# Patient Record
Sex: Female | Born: 1970 | Hispanic: Yes | Marital: Married | Smoking: Never smoker
Health system: Southern US, Community
[De-identification: ages and names within clinical notes are randomized; demographics above are authoritative.]

## PROBLEM LIST (undated history)

## (undated) DIAGNOSIS — E079 Disorder of thyroid, unspecified: Secondary | ICD-10-CM

## (undated) DIAGNOSIS — I499 Cardiac arrhythmia, unspecified: Secondary | ICD-10-CM

## (undated) DIAGNOSIS — E039 Hypothyroidism, unspecified: Secondary | ICD-10-CM

## (undated) DIAGNOSIS — D649 Anemia, unspecified: Secondary | ICD-10-CM

## (undated) HISTORY — PX: NO PAST SURGERIES: SHX2092

## (undated) HISTORY — PX: ABDOMINAL HYSTERECTOMY: SHX81

---

## 2004-12-09 ENCOUNTER — Ambulatory Visit: Payer: Self-pay | Admitting: Family Medicine

## 2015-04-30 ENCOUNTER — Encounter: Payer: Self-pay | Admitting: Emergency Medicine

## 2015-04-30 ENCOUNTER — Emergency Department
Admission: EM | Admit: 2015-04-30 | Discharge: 2015-04-30 | Disposition: A | Discharge status: Home / Self Care | Payer: Self-pay | Attending: Emergency Medicine | Admitting: Emergency Medicine

## 2015-04-30 DIAGNOSIS — N939 Abnormal uterine and vaginal bleeding, unspecified: Secondary | ICD-10-CM | POA: Insufficient documentation

## 2015-04-30 DIAGNOSIS — D509 Iron deficiency anemia, unspecified: Secondary | ICD-10-CM | POA: Insufficient documentation

## 2015-04-30 HISTORY — DX: Disorder of thyroid, unspecified: E07.9

## 2015-04-30 LAB — CBC
HEMATOCRIT: 26.6 % — AB (ref 35.0–47.0)
HEMOGLOBIN: 8 g/dL — AB (ref 12.0–16.0)
MCH: 21 pg — AB (ref 26.0–34.0)
MCHC: 29.9 g/dL — ABNORMAL LOW (ref 32.0–36.0)
MCV: 70.2 fL — AB (ref 80.0–100.0)
PLATELETS: 359 10*3/uL (ref 150–440)
RBC: 3.79 MIL/uL — AB (ref 3.80–5.20)
RDW: 19.6 % — ABNORMAL HIGH (ref 11.5–14.5)
WBC: 8.2 10*3/uL (ref 3.6–11.0)

## 2015-04-30 LAB — TYPE AND SCREEN
ABO/RH(D): O POS
Antibody Screen: NEGATIVE

## 2015-04-30 LAB — ABO/RH: ABO/RH(D): O POS

## 2015-04-30 NOTE — ED Notes (Addendum)
Lab results back; WBC 8.2, Hgb 8.0, Hct 26.6, and platelets 359. Results compared to previous visits with no significant changes noted in Hgb/Hct from 04/24/15 visit; results reviewed with EDP. Patient is asymptomatic at the time of triage other than slight pallor and reports of fatigue. Patient with VSS; HR 74, R16, BP 118/61. EDP advising that patient would not be emergently transfused tonight based on labs, VS, and presenting condition. EDP advising that OB/GYN that sent patient to ED for 2 unit PRBC transfusion be contacted to see if she wanted her to be admitted to her service directly, otherwise patient would be referred back to her for outpatient transfusion.

## 2015-04-30 NOTE — ED Notes (Signed)
Dr. Newman Nip returned page at this time; spoke with this RN. MD made aware of labs, VS, and presenting condition. MD also made aware of conversations between this RN and EDPs Archie Balboa and [place]) that indicated that patient would not be transfused tonight on an emergent basis based on the aforementioned. Per Dr. Newman Nip, patient was sent to the ED for transfusion based on the fact that she was indeed symptomatic. MD cited that patient had profoundly low iron. Increasing fatigue, continued bleeding, and it is expected that patient's Hgb/Hct will continue to drop. MD made aware that patient's labs today as compared to the last ones that we have access to that were performed on 04/24/15. MD wanting patient to be asked if she would like to be admitted tonight through the OB/GYN service, or if she wanted to have transfusion scheduled on an outpatient basis tomorrow. This RN to speak with patient and call [place], MD back for disposition.

## 2015-04-30 NOTE — ED Notes (Signed)
Dr. Newman Nip paged by this RN.

## 2015-04-30 NOTE — ED Notes (Signed)
Patient presents to the ED with vaginal bleeding x 12 days and low hemoglobin.  Patient was sent to the ED from her primary care doctor with instructions to receive 2 units of packed red blood cells for severe iron deficient anemia.  Patient is scheduled for a hysterectomy in a couple of weeks.  Patient appears pale.  Mucous membranes appear within normal limits.

## 2015-04-30 NOTE — ED Notes (Signed)
Patient presented with options of being admitted to hospital for blood transfusion, or to go home and follow up with Dr. Newman Nip tomorrow. Options reviewed with patient via interpreter; questions fielded at length by RN and interpreter. Patient electing to follow up tomorrow on an outpatient basis - this RN to return call to [facility], MD to make her aware that patient does not want to be admitted and feels like she can wait until tomorrow and see her on an outpatient basis. MD acknowledged decision and advised to make patient aware that the office would contact her tomorrow with further instructions regarding blood transfusion and continued care.

## 2015-05-02 ENCOUNTER — Ambulatory Visit: Payer: Self-pay | Admitting: Oncology

## 2015-05-05 ENCOUNTER — Inpatient Hospital Stay: Payer: BC Managed Care – PPO | Attending: Oncology | Admitting: Oncology

## 2015-05-05 VITALS — BP 116/74 | HR 69 | Temp 99.5°F | Resp 16 | Wt 188.5 lb

## 2015-05-05 DIAGNOSIS — N939 Abnormal uterine and vaginal bleeding, unspecified: Secondary | ICD-10-CM | POA: Insufficient documentation

## 2015-05-05 DIAGNOSIS — E079 Disorder of thyroid, unspecified: Secondary | ICD-10-CM | POA: Diagnosis not present

## 2015-05-05 DIAGNOSIS — D500 Iron deficiency anemia secondary to blood loss (chronic): Secondary | ICD-10-CM | POA: Diagnosis present

## 2015-05-05 DIAGNOSIS — D509 Iron deficiency anemia, unspecified: Secondary | ICD-10-CM | POA: Insufficient documentation

## 2015-05-05 DIAGNOSIS — D649 Anemia, unspecified: Secondary | ICD-10-CM

## 2015-05-05 NOTE — Progress Notes (Signed)
Patient has history of anemia that has recently gotten worse due to vaginal bleeding for the past 2 months.  She is scheduled for hysterectomy on 05/30/2015 and her GYN would like for her to be evaluated for possible blood transfusion before surgery.

## 2015-05-08 ENCOUNTER — Inpatient Hospital Stay: Payer: BC Managed Care – PPO

## 2015-05-08 VITALS — BP 100/63 | HR 71 | Temp 96.2°F | Resp 18

## 2015-05-08 DIAGNOSIS — D509 Iron deficiency anemia, unspecified: Secondary | ICD-10-CM

## 2015-05-08 DIAGNOSIS — D500 Iron deficiency anemia secondary to blood loss (chronic): Secondary | ICD-10-CM | POA: Diagnosis not present

## 2015-05-08 MED ORDER — SODIUM CHLORIDE 0.9 % IV SOLN
Freq: Once | INTRAVENOUS | Status: AC
  Administered 2015-05-08: 11:00:00 via INTRAVENOUS
  Filled 2015-05-08: qty 1000

## 2015-05-08 MED ORDER — SODIUM CHLORIDE 0.9 % IV SOLN
510.0000 mg | Freq: Once | INTRAVENOUS | Status: AC
  Administered 2015-05-08: 510 mg via INTRAVENOUS
  Filled 2015-05-08: qty 17

## 2015-05-08 NOTE — H&P (Signed)
GYN PRE-OP VISIT  CC: menorrhagia, fatigue, fibroid uterus   Subjective:    Kerry Paul is a 45 y.o. female who presents for preop visit for TLH/BS, cystoscopy for fibroid uterus.   Feeling ok today. Bleeding has stopped now that she is on the provera daily. Has appt w/ heme for IV iron infusion today.   Hx of AUB: Patient has been having menorrhagia for the past year worsening over the past 2 months. She has been bleeding for the past 12 days, sometimes passing large clots and going through a pad and tampon every 2 hours. She was in [place] over the holidays and was told that she needed blood transfusions and surgery to remove her uterus. She was given provera which stopped the bleeding initially but now it is back. She has stopped taking the provera. Al ultrasound done in the ED showed a large fibroid uterus with a SM fibroid (3cm). She has been seen in the ED multiple times in the past 2 months with hb 7.4-8.0. They did not transfuse her there because her vitals were stable.  She also c/o significant fatigue with inability to do her daily activities. She sometimes "passes out" from the weakness. She is on iron twice daily for severe iron deficiency anemia but that has not helped her fatigue.  She is interested in definitive treatment with hysterectomy.  She is now seeing hematology for iron infusions   She also has hypothyroidism on synthroid.   Gynecologic History Patient's last menstrual period was 04/23/2015 (exact date).  Menarche: 14 Normal until this past year  Contraception: none Sexually active: no Desires STD screening: no  Last Pap: 01/2014. Results were: normal - no h/o abnl paps  Obstetric History            OB History  Gravida Para Term Preterm AB SAB TAB Ectopic Multiple Living  0 0 0 0 0 0 0 0 0 0       NSVD x 2  Past Medical History:  has a past medical history of Hypothyroidism (acquired), unspecified. Problem List: has Hypothyroidism  (acquired), unspecified; Major depressive disorder, single episode, unspecified; Other early skin lesions of yaws; Asymptomatic varicose veins of lower extremity; Anemia due to blood loss; Submucous myoma of uterus; and Menorrhagia with regular cycle on her problem list. Past Surgical History:  has no past surgical history on file. Family History: family history includes Deep vein thrombosis in her maternal grandmother; Diabetes type II in her paternal grandmother; Hyperlipidemia in her paternal grandmother; Hypertension in her mother and paternal grandmother; No Known Problems in her brother, brother, brother, daughter, father, son, and son. Social History:  reports that she has never smoked. She has never used smokeless tobacco. She reports that she does not drink alcohol or use illicit drugs. Current Medications: has a current medication list which includes the following prescription(s): ferrous sulfate, levothyroxine, and medroxyprogesterone. Prior to encounter Medications:        Current Outpatient Prescriptions on File Prior to Visit  Medication Sig Dispense Refill  . ferrous sulfate 325 (65 FE) MG EC tablet Take 1 tablet (325 mg total) by mouth 3 (three) times daily with meals. 30 tablet 0  . levothyroxine (SYNTHROID, LEVOTHROID) 50 MCG tablet TAKE ONE TABLET BY MOUTH ONCE DAILY    . medroxyPROGESTERone (PROVERA) 10 MG tablet Take 1 tablet (10 mg total) by mouth once daily. 40 tablet 0   No current facility-administered medications on file prior to visit.    Allergies: has No Known  Allergies.  Review of Systems 14 systems reviewed pertinent positives and negatives as noted in the HPI and below.   Objective:       Vitals:   05/08/15 0831  BP: 112/68  Pulse: 79   General appearance: alert, appears stated age, cooperative and fatigued Head: Normocephalic, without obvious abnormality, atraumatic Eyes: conjunctivae/corneas clear. PERRL, EOM's intact. Fundi benign. Sclera  anicteric.  Lungs: clear to auscultation bilaterally Heart: regular rate and rhythm, S1, S2 normal, no murmur, click, rub or gallop   Previous exam: Abdomen: soft, non-tender; bowel sounds normal; no masses, no organomegaly Pelvic: cervix normal in appearance, external genitalia normal, no adnexal masses or tenderness, no bladder tenderness, no cervical motion tenderness, rectovaginal septum normal, urethra without abnormality or discharge, vagina normal without discharge and 13 wk uterus, mobile with some descent Extremities: extremities normal, atraumatic, no cyanosis or edema Skin: Skin color, texture, turgor normal. No rashes or lesions Lymph nodes: Inguinal adenopathy: none     04/11/15 TVUS: The uterus is anteverted. It measures 11.3 x 6.6 x 8.6 cm. The endometrial stripe is not well visualized. Within the anterior uterus there is a heterogenously echogenic round area that measures 3.8 x 2.5 x 4.4 cm. It has some characteristics suggesting a fibroid. In the region of the endometrium there is another heterogenous structure that measures 4.1 x 4.8 x 6.9 cm. It is not clear if this is within the endometrium, associated with the anterior uterine structure, or if it is a stand alone structure such as an endometrial mass.. Adjacent to these two large structures in the left uterus is a round hypoechoic structure that measures 2.3 x 1.9 x 1.6 cm and likely represents a submucosal fibroid. No free fluid is identified in the pelvis.  The right ovary is normal in appearance with appropriate vascular flow. The right ovary measures 4.5 x 3.4 x 4.4 cm. It contains a simple cyst that measures 3.7 x 2.6 x 2.6 cm.  The left ovary is normal in appearance with appropriate vascular flow. The left ovary measures 3.2 x 1.9 x 2.8 cm.  The urinary bladder is distended and is without abnormality.  There is a large nabothian cyst identified. It measures 2.4 x 1.8 x 2.2 cm.  Labs: -       Lab Results  Component Value Date   WBC 13.2 (H) 04/24/2015   HGB 8.0 (L) 04/24/2015   HCT 29.0 (L) 04/24/2015   PLT 350 04/24/2015   -      Lab Results  Component Value Date   NA 137 04/24/2015   K 3.6 04/24/2015   CL 105 04/24/2015   CO2 24 04/24/2015   BUN 12 04/24/2015   CREATININE 0.7 04/24/2015   CALCIUM 9.0 04/24/2015   ALB 3.5 04/24/2015   TBILI 0.2 (L) 04/24/2015   ALKPHOS 53 04/24/2015   AST 24 04/24/2015   ALT 17 04/24/2015   GLUCOSE 169 (H) 04/24/2015   GFR >60 04/24/2015   -      Lab Results  Component Value Date   TSH 1.35 04/02/2015   T4FREE 0.92 04/02/2015    04/24/15: Iron <5  01/2015: HbA1C - 5.7%  04/30/15 Embx:  FRAGMENTS OF PROLIFERATIVE PHASE ENDOMETRIUM WITH BREAKDOWN CHANGES,  CONSISTENT WITH ANOVULATORY CYCLE. NEGATIVE FOR DYSPLASIA, VIRAL  EFFECT, AND MALIGNANCY.   Assessment:    45yo G2P2 with symptomatic iron-deficiency anemia due to chronic blood loss from menorrhagia/fibroid uterus. Desiring definitive management with hysterectomy. Plan for TLH/BS/cystoscopy.  Plan:  1. Fibroid uterus - AUB - provera daily until surgery to minimize blood loss - r/b/a of TLH/BS/cystoscopy reviewed with patient including risks of bleeding/pain/infection, injury to bowel, bladder, or other organ, risk of cuff dehiscence and wound separation. All questions answered. Consents signed.  - pyridium 200mg  po in preop area  2. Anemia - pt has referral to heme for IV iron - increased to TID iron in interim with vitamin C and colace   Joylene Igo, MD

## 2015-05-09 ENCOUNTER — Ambulatory Visit: Payer: Self-pay | Admitting: Oncology

## 2015-05-15 ENCOUNTER — Inpatient Hospital Stay: Payer: BC Managed Care – PPO

## 2015-05-15 DIAGNOSIS — D500 Iron deficiency anemia secondary to blood loss (chronic): Secondary | ICD-10-CM | POA: Diagnosis not present

## 2015-05-15 DIAGNOSIS — D509 Iron deficiency anemia, unspecified: Secondary | ICD-10-CM

## 2015-05-15 MED ORDER — SODIUM CHLORIDE 0.9 % IV SOLN
510.0000 mg | Freq: Once | INTRAVENOUS | Status: AC
  Administered 2015-05-15: 510 mg via INTRAVENOUS
  Filled 2015-05-15: qty 17

## 2015-05-15 MED ORDER — SODIUM CHLORIDE 0.9 % IV SOLN
Freq: Once | INTRAVENOUS | Status: AC
  Administered 2015-05-15: 14:00:00 via INTRAVENOUS
  Filled 2015-05-15: qty 1000

## 2015-05-15 NOTE — Progress Notes (Signed)
Kerry Paul  [phone] [phone]  ID: Phineas Inches OB: 1970/11/23  MR#: [id number]  [id number]  Patient Care Team: Lorette Ang, MD as PCP - General (Obstetrics and Gynecology)  CHIEF COMPLAINT:  Chief Complaint  Patient presents with  . New Evaluation  . Anemia    INTERVAL HISTORY: Patient is a 45 year old female with a long-standing history of iron deficiency anemia that has had worsening vaginal bleeding over the past several months. She is scheduled for a hysterectomy on May 30, 2015 and gynecology would like her evaluated in treated for her anemia prior to her scheduled surgery. She currently feels well and is asymptomatic. She has no neurologic complaints. She denies any recent fevers. She does not complain of weakness or fatigue today. She has no chest pain or shortness of breath. She denies any nausea, vomiting, constipation, or diarrhea. She has no urinary complaints. Patient otherwise feels well and offers no further specific complaints.  REVIEW OF SYSTEMS:   Review of Systems  Constitutional: Negative for fever, weight loss and malaise/fatigue.  Respiratory: Negative.  Negative for cough.   Cardiovascular: Negative.  Negative for chest pain.  Gastrointestinal: Negative.  Negative for blood in stool and melena.  Genitourinary: Negative.   Musculoskeletal: Negative.   Neurological: Negative.  Negative for weakness.    As per HPI. Otherwise, a complete review of systems is negatve.  PAST MEDICAL HISTORY: Past Medical History  Diagnosis Date  . Thyroid disease     PAST SURGICAL HISTORY: No past surgical history on file.  FAMILY HISTORY: No reported history of malignancy or chronic disease.     ADVANCED DIRECTIVES:    HEALTH MAINTENANCE: Social History  Substance Use Topics  . Smoking status: Never Smoker   . Smokeless tobacco: Not on file  . Alcohol Use: No     Colonoscopy:  PAP:  Bone  density:  Lipid panel:  No Known Allergies  No current outpatient prescriptions on file.   No current facility-administered medications for this visit.    OBJECTIVE: Filed Vitals:   05/05/15 0902  BP: 116/74  Pulse: 69  Temp: 99.5 F (37.5 C)  Resp: 16     Body mass index is 32.34 kg/(m^2).    ECOG FS:0 - Asymptomatic  General: Well-developed, well-nourished, no acute distress. Eyes: Pink conjunctiva, anicteric sclera. HEENT: Normocephalic, moist mucous membranes, clear oropharnyx. Lungs: Clear to auscultation bilaterally. Heart: Regular rate and rhythm. No rubs, murmurs, or gallops. Abdomen: Soft, nontender, nondistended. No organomegaly noted, normoactive bowel sounds. Musculoskeletal: No edema, cyanosis, or clubbing. Neuro: Alert, answering all questions appropriately. Cranial nerves grossly intact. Skin: No rashes or petechiae noted. Psych: Normal affect. Lymphatics: No cervical, calvicular, axillary or inguinal LAD.   LAB RESULTS:  No results found for: NA, K, CL, CO2, GLUCOSE, BUN, CREATININE, CALCIUM, PROT, ALBUMIN, AST, ALT, ALKPHOS, BILITOT, GFRNONAA, GFRAA  Lab Results  Component Value Date   WBC 8.2 04/30/2015   HGB 8.0* 04/30/2015   HCT 26.6* 04/30/2015   MCV 70.2* 04/30/2015   PLT 359 04/30/2015   No results found for: IRON, TIBC, IRONPCTSAT  No results found for: FERRITIN   STUDIES: No results found.  ASSESSMENT: Iron deficiency anemia secondary to heavy menstrual bleeding.  PLAN:    1. Iron deficiency anemia: Patient has a significantly decreased hemoglobin at 8.0. On April 24, 2015 patient had a ferritin of 9, total iron of less than 5, and percent saturation of 1%. She will benefit from IV Feraheme. Return to  clinic on February 16 and then again on May 15, 2015 to receive 510 mg IV Feraheme. Patient will then return to clinic on May 28, 2015 for repeat laboratory work and further evaluation. Goal will hemoglobin will be approximately  10.0 prior to her hysterectomy scheduled 2 days later. If patient requires blood transfusion, she can receive one that day prior to her surgery.   Patient expressed understanding and was in agreement with this plan. She also understands that She can call clinic at any time with any questions, concerns, or complaints.    Lloyd Huger, MD   05/15/2015 4:57 PM

## 2015-05-16 ENCOUNTER — Ambulatory Visit: Payer: Self-pay | Admitting: Oncology

## 2015-05-22 ENCOUNTER — Encounter
Admission: RE | Admit: 2015-05-22 | Discharge: 2015-05-22 | Disposition: A | Discharge status: Home / Self Care | Payer: BC Managed Care – PPO | Source: Ambulatory Visit | Attending: Obstetrics and Gynecology | Admitting: Obstetrics and Gynecology

## 2015-05-22 DIAGNOSIS — Z01812 Encounter for preprocedural laboratory examination: Secondary | ICD-10-CM | POA: Insufficient documentation

## 2015-05-22 HISTORY — DX: Cardiac arrhythmia, unspecified: I49.9

## 2015-05-22 HISTORY — DX: Hypothyroidism, unspecified: E03.9

## 2015-05-22 HISTORY — DX: Anemia, unspecified: D64.9

## 2015-05-22 LAB — TYPE AND SCREEN
ABO/RH(D): O POS
ANTIBODY SCREEN: NEGATIVE

## 2015-05-22 LAB — CBC
HEMATOCRIT: 36.9 % (ref 35.0–47.0)
HEMOGLOBIN: 11.8 g/dL — AB (ref 12.0–16.0)
MCH: 25.2 pg — AB (ref 26.0–34.0)
MCHC: 31.8 g/dL — AB (ref 32.0–36.0)
MCV: 79.1 fL — AB (ref 80.0–100.0)
Platelets: 210 10*3/uL (ref 150–440)
RBC: 4.67 MIL/uL (ref 3.80–5.20)
RDW: 28.4 % — AB (ref 11.5–14.5)
WBC: 7.2 10*3/uL (ref 3.6–11.0)

## 2015-05-22 NOTE — Patient Instructions (Signed)
  Your procedure is scheduled on: 05-30-15 (FRIDAY) Report to [facility] To find out your arrival time please call [phone] between 1PM - 3PM on 05-29-15 (THURSDAY)  Remember: Instructions that are not followed completely may result in serious medical risk, up to and including death, or upon the discretion of your surgeon and anesthesiologist your surgery may need to be rescheduled.    _X___ 1. Do not eat food or drink liquids after midnight. No gum chewing or hard candies.     _X___ 2. No Alcohol for 24 hours before or after surgery.   ____ 3. Bring all medications with you on the day of surgery if instructed.    _X___ 4. Notify your doctor if there is any change in your medical condition     (cold, fever, infections).     Do not wear jewelry, make-up, hairpins, clips or nail polish.  Do not wear lotions, powders, or perfumes. You may wear deodorant.  Do not shave 48 hours prior to surgery. Men may shave face and neck.  Do not bring valuables to the hospital.    [facility] is not responsible for any belongings or valuables.               Contacts, dentures or bridgework may not be worn into surgery.  Leave your suitcase in the car. After surgery it may be brought to your room.  For patients admitted to the hospital, discharge time is determined by your treatment team.   Patients discharged the day of surgery will not be allowed to drive home.   Please read over the following fact sheets that you were given:      _X___ Take these medicines the morning of surgery with A SIP OF WATER:    1. LEVOTHYROXINE  2.   3.   4.  5.  6.  ____ Fleet Enema (as directed)   _X___ Use CHG Soap as directed  ____ Use inhalers on the day of surgery  ____ Stop metformin 2 days prior to surgery    ____ Take 1/2 of usual insulin dose the night before surgery and none on the morning of surgery.   ____ Stop Coumadin/Plavix/aspirin-N/A  ____ Stop  Anti-inflammatories-NO NSAIDS OR ASPIRIN PRODUCTS-TYLENOL OK TO TAKE   _X___ Stop supplements until after surgery-STOP BEEF LIVER NOW   ____ Bring C-Pap to the hospital.

## 2015-05-28 ENCOUNTER — Inpatient Hospital Stay: Payer: BC Managed Care – PPO | Attending: Oncology

## 2015-05-28 ENCOUNTER — Inpatient Hospital Stay: Payer: BC Managed Care – PPO

## 2015-05-28 ENCOUNTER — Inpatient Hospital Stay (HOSPITAL_BASED_OUTPATIENT_CLINIC_OR_DEPARTMENT_OTHER): Payer: BC Managed Care – PPO | Admitting: Oncology

## 2015-05-28 VITALS — BP 103/69 | HR 65 | Temp 98.2°F | Resp 16 | Wt 192.9 lb

## 2015-05-28 DIAGNOSIS — Z79899 Other long term (current) drug therapy: Secondary | ICD-10-CM

## 2015-05-28 DIAGNOSIS — E039 Hypothyroidism, unspecified: Secondary | ICD-10-CM | POA: Insufficient documentation

## 2015-05-28 DIAGNOSIS — N920 Excessive and frequent menstruation with regular cycle: Secondary | ICD-10-CM

## 2015-05-28 DIAGNOSIS — D500 Iron deficiency anemia secondary to blood loss (chronic): Secondary | ICD-10-CM

## 2015-05-28 DIAGNOSIS — D509 Iron deficiency anemia, unspecified: Secondary | ICD-10-CM

## 2015-05-28 DIAGNOSIS — D649 Anemia, unspecified: Secondary | ICD-10-CM

## 2015-05-28 LAB — IRON AND TIBC
IRON: 82 ug/dL (ref 28–170)
SATURATION RATIOS: 23 % (ref 10.4–31.8)
TIBC: 355 ug/dL (ref 250–450)
UIBC: 274 ug/dL

## 2015-05-28 LAB — FERRITIN: Ferritin: 169 ng/mL (ref 11–307)

## 2015-05-28 LAB — CBC WITH DIFFERENTIAL/PLATELET
BASOS ABS: 0.1 10*3/uL (ref 0–0.1)
BASOS PCT: 1 %
EOS ABS: 1 10*3/uL — AB (ref 0–0.7)
Eosinophils Relative: 15 %
HCT: 39.3 % (ref 35.0–47.0)
HEMOGLOBIN: 12.8 g/dL (ref 12.0–16.0)
Lymphocytes Relative: 24 %
Lymphs Abs: 1.6 10*3/uL (ref 1.0–3.6)
MCH: 26 pg (ref 26.0–34.0)
MCHC: 32.5 g/dL (ref 32.0–36.0)
MCV: 80.1 fL (ref 80.0–100.0)
MONOS PCT: 6 %
Monocytes Absolute: 0.4 10*3/uL (ref 0.2–0.9)
NEUTROS ABS: 3.6 10*3/uL (ref 1.4–6.5)
NEUTROS PCT: 54 %
Platelets: 204 10*3/uL (ref 150–440)
RBC: 4.91 MIL/uL (ref 3.80–5.20)
RDW: 27.2 % — AB (ref 11.5–14.5)
WBC: 6.6 10*3/uL (ref 3.6–11.0)

## 2015-05-28 LAB — SAMPLE TO BLOOD BANK

## 2015-05-28 NOTE — Progress Notes (Signed)
Patient is scheduled for hysterectomy in 2 days.

## 2015-05-29 ENCOUNTER — Encounter: Payer: Self-pay | Admitting: *Deleted

## 2015-05-30 ENCOUNTER — Ambulatory Visit: Payer: BC Managed Care – PPO | Admitting: Anesthesiology

## 2015-05-30 ENCOUNTER — Observation Stay
Admission: RE | Admit: 2015-05-30 | Discharge: 2015-05-31 | Disposition: A | Discharge status: Home / Self Care | Payer: BC Managed Care – PPO | Source: Ambulatory Visit | Attending: Obstetrics and Gynecology | Admitting: Obstetrics and Gynecology

## 2015-05-30 ENCOUNTER — Encounter
Admission: RE | Disposition: A | Discharge status: Home / Self Care | Payer: Self-pay | Source: Ambulatory Visit | Attending: Obstetrics and Gynecology

## 2015-05-30 ENCOUNTER — Encounter: Payer: Self-pay | Admitting: *Deleted

## 2015-05-30 DIAGNOSIS — N711 Chronic inflammatory disease of uterus: Principal | ICD-10-CM | POA: Insufficient documentation

## 2015-05-30 DIAGNOSIS — Z793 Long term (current) use of hormonal contraceptives: Secondary | ICD-10-CM | POA: Diagnosis not present

## 2015-05-30 DIAGNOSIS — N939 Abnormal uterine and vaginal bleeding, unspecified: Secondary | ICD-10-CM | POA: Diagnosis present

## 2015-05-30 DIAGNOSIS — E039 Hypothyroidism, unspecified: Secondary | ICD-10-CM | POA: Diagnosis not present

## 2015-05-30 DIAGNOSIS — A662 Other early skin lesions of yaws: Secondary | ICD-10-CM | POA: Insufficient documentation

## 2015-05-30 DIAGNOSIS — Z90710 Acquired absence of both cervix and uterus: Secondary | ICD-10-CM | POA: Diagnosis present

## 2015-05-30 DIAGNOSIS — N838 Other noninflammatory disorders of ovary, fallopian tube and broad ligament: Secondary | ICD-10-CM | POA: Insufficient documentation

## 2015-05-30 DIAGNOSIS — N920 Excessive and frequent menstruation with regular cycle: Secondary | ICD-10-CM | POA: Insufficient documentation

## 2015-05-30 DIAGNOSIS — F329 Major depressive disorder, single episode, unspecified: Secondary | ICD-10-CM | POA: Diagnosis not present

## 2015-05-30 DIAGNOSIS — N800 Endometriosis of uterus: Secondary | ICD-10-CM | POA: Diagnosis not present

## 2015-05-30 DIAGNOSIS — D259 Leiomyoma of uterus, unspecified: Secondary | ICD-10-CM | POA: Diagnosis present

## 2015-05-30 DIAGNOSIS — Z79899 Other long term (current) drug therapy: Secondary | ICD-10-CM | POA: Insufficient documentation

## 2015-05-30 DIAGNOSIS — D500 Iron deficiency anemia secondary to blood loss (chronic): Secondary | ICD-10-CM | POA: Insufficient documentation

## 2015-05-30 DIAGNOSIS — N72 Inflammatory disease of cervix uteri: Secondary | ICD-10-CM | POA: Diagnosis not present

## 2015-05-30 HISTORY — PX: CYSTOSCOPY: SHX5120

## 2015-05-30 HISTORY — PX: LAPAROSCOPIC HYSTERECTOMY: SHX1926

## 2015-05-30 LAB — POCT PREGNANCY, URINE: Preg Test, Ur: NEGATIVE

## 2015-05-30 SURGERY — HYSTERECTOMY, TOTAL, LAPAROSCOPIC
Anesthesia: General | Wound class: Clean Contaminated

## 2015-05-30 MED ORDER — MIDAZOLAM HCL 2 MG/2ML IJ SOLN
INTRAMUSCULAR | Status: DC | PRN
  Administered 2015-05-30: 2 mg via INTRAVENOUS

## 2015-05-30 MED ORDER — ROCURONIUM BROMIDE 100 MG/10ML IV SOLN
INTRAVENOUS | Status: DC | PRN
  Administered 2015-05-30: 10 mg via INTRAVENOUS
  Administered 2015-05-30 (×2): 20 mg via INTRAVENOUS
  Administered 2015-05-30: 10 mg via INTRAVENOUS
  Administered 2015-05-30: 40 mg via INTRAVENOUS

## 2015-05-30 MED ORDER — KETOROLAC TROMETHAMINE 15 MG/ML IJ SOLN
15.0000 mg | Freq: Four times a day (QID) | INTRAMUSCULAR | Status: DC | PRN
  Administered 2015-05-30 – 2015-05-31 (×3): 15 mg via INTRAVENOUS
  Filled 2015-05-30 (×3): qty 1

## 2015-05-30 MED ORDER — CEFAZOLIN SODIUM-DEXTROSE 2-3 GM-% IV SOLR
INTRAVENOUS | Status: AC
  Administered 2015-05-30: 2000 mg
  Filled 2015-05-30: qty 50

## 2015-05-30 MED ORDER — FENTANYL CITRATE (PF) 100 MCG/2ML IJ SOLN
INTRAMUSCULAR | Status: DC | PRN
  Administered 2015-05-30: 100 ug via INTRAVENOUS
  Administered 2015-05-30 (×4): 50 ug via INTRAVENOUS

## 2015-05-30 MED ORDER — DOCUSATE SODIUM 100 MG PO CAPS: 100.0000 mg | ORAL_CAPSULE | Freq: Two times a day (BID) | ORAL | Status: DC

## 2015-05-30 MED ORDER — SIMETHICONE 80 MG PO CHEW: 40.0000 mg | CHEWABLE_TABLET | Freq: Four times a day (QID) | ORAL | Status: DC | PRN

## 2015-05-30 MED ORDER — KETOROLAC TROMETHAMINE 15 MG/ML IJ SOLN
15.0000 mg | Freq: Four times a day (QID) | INTRAMUSCULAR | Status: AC
  Administered 2015-05-30: 15 mg via INTRAVENOUS
  Filled 2015-05-30 (×2): qty 1

## 2015-05-30 MED ORDER — PHENAZOPYRIDINE HCL 200 MG PO TABS
200.0000 mg | ORAL_TABLET | Freq: Once | ORAL | Status: AC
  Administered 2015-05-30: 200 mg via ORAL
  Filled 2015-05-30: qty 1

## 2015-05-30 MED ORDER — ONDANSETRON 4 MG PO TBDP: 4.0000 mg | ORAL_TABLET | Freq: Four times a day (QID) | ORAL | Status: DC | PRN

## 2015-05-30 MED ORDER — DIPHENHYDRAMINE HCL 12.5 MG/5ML PO ELIX
12.5000 mg | ORAL_SOLUTION | Freq: Four times a day (QID) | ORAL | Status: DC | PRN
  Filled 2015-05-30: qty 5

## 2015-05-30 MED ORDER — FAMOTIDINE 20 MG PO TABS
20.0000 mg | ORAL_TABLET | Freq: Once | ORAL | Status: AC
  Administered 2015-05-30: 20 mg via ORAL

## 2015-05-30 MED ORDER — CEFAZOLIN (ANCEF) 1 G IV SOLR
2.0000 g | INTRAVENOUS | Status: AC
  Filled 2015-05-30: qty 2

## 2015-05-30 MED ORDER — FENTANYL CITRATE (PF) 100 MCG/2ML IJ SOLN: 25.0000 ug | INTRAMUSCULAR | Status: DC | PRN

## 2015-05-30 MED ORDER — PHENYLEPHRINE HCL 10 MG/ML IJ SOLN
INTRAMUSCULAR | Status: DC | PRN
  Administered 2015-05-30 (×4): 100 ug via INTRAVENOUS

## 2015-05-30 MED ORDER — SUGAMMADEX SODIUM 200 MG/2ML IV SOLN
INTRAVENOUS | Status: DC | PRN
Start: 2015-05-30 — End: 2015-05-30
  Administered 2015-05-30: 175 mg via INTRAVENOUS

## 2015-05-30 MED ORDER — DOCUSATE SODIUM 100 MG PO CAPS
100.0000 mg | ORAL_CAPSULE | Freq: Two times a day (BID) | ORAL | Status: DC
  Administered 2015-05-30 – 2015-05-31 (×3): 100 mg via ORAL
  Filled 2015-05-30 (×3): qty 1

## 2015-05-30 MED ORDER — BUPIVACAINE HCL (PF) 0.5 % IJ SOLN
INTRAMUSCULAR | Status: AC
  Filled 2015-05-30: qty 30

## 2015-05-30 MED ORDER — LACTATED RINGERS IV SOLN
INTRAVENOUS | Status: DC
  Administered 2015-05-30 (×2): via INTRAVENOUS

## 2015-05-30 MED ORDER — OXYCODONE HCL 5 MG PO TABS
5.0000 mg | ORAL_TABLET | ORAL | Status: DC | PRN
  Administered 2015-05-31: 5 mg via ORAL
  Filled 2015-05-30: qty 1

## 2015-05-30 MED ORDER — DEXAMETHASONE SODIUM PHOSPHATE 10 MG/ML IJ SOLN
INTRAMUSCULAR | Status: DC | PRN
  Administered 2015-05-30: 10 mg via INTRAVENOUS

## 2015-05-30 MED ORDER — LACTATED RINGERS IV SOLN
INTRAVENOUS | Status: DC
  Administered 2015-05-30 (×2): via INTRAVENOUS

## 2015-05-30 MED ORDER — DIPHENHYDRAMINE HCL 50 MG/ML IJ SOLN: 12.5000 mg | Freq: Four times a day (QID) | INTRAMUSCULAR | Status: DC | PRN

## 2015-05-30 MED ORDER — ACETAMINOPHEN 325 MG PO TABS: 650.0000 mg | ORAL_TABLET | Freq: Four times a day (QID) | ORAL | Status: DC | PRN

## 2015-05-30 MED ORDER — ONDANSETRON HCL 4 MG/2ML IJ SOLN: 4.0000 mg | Freq: Four times a day (QID) | INTRAMUSCULAR | Status: DC | PRN

## 2015-05-30 MED ORDER — FAMOTIDINE 20 MG PO TABS
ORAL_TABLET | ORAL | Status: AC
  Administered 2015-05-30: 20 mg via ORAL
  Filled 2015-05-30: qty 1

## 2015-05-30 MED ORDER — OXYCODONE HCL 5 MG PO TABS: 5.0000 mg | ORAL_TABLET | ORAL | Status: DC | PRN

## 2015-05-30 MED ORDER — BUPIVACAINE HCL 0.25 % IJ SOLN
INTRAMUSCULAR | Status: DC | PRN
  Administered 2015-05-30: 10 mL

## 2015-05-30 MED ORDER — ACETAMINOPHEN 650 MG RE SUPP
650.0000 mg | Freq: Four times a day (QID) | RECTAL | Status: DC | PRN
  Filled 2015-05-30: qty 1

## 2015-05-30 MED ORDER — ONDANSETRON HCL 4 MG/2ML IJ SOLN
INTRAMUSCULAR | Status: DC | PRN
  Administered 2015-05-30: 4 mg via INTRAVENOUS

## 2015-05-30 MED ORDER — PROPOFOL 10 MG/ML IV BOLUS
INTRAVENOUS | Status: DC | PRN
  Administered 2015-05-30: 150 mg via INTRAVENOUS

## 2015-05-30 MED ORDER — ONDANSETRON HCL 4 MG/2ML IJ SOLN
4.0000 mg | Freq: Once | INTRAMUSCULAR | Status: AC | PRN
  Administered 2015-05-30: 4 mg via INTRAVENOUS

## 2015-05-30 MED ORDER — ONDANSETRON HCL 4 MG/2ML IJ SOLN
INTRAMUSCULAR | Status: AC
  Administered 2015-05-30: 4 mg via INTRAVENOUS
  Filled 2015-05-30: qty 2

## 2015-05-30 MED ORDER — KETOROLAC TROMETHAMINE 30 MG/ML IJ SOLN
INTRAMUSCULAR | Status: AC
  Filled 2015-05-30: qty 1

## 2015-05-30 SURGICAL SUPPLY — 53 items
APPLICATOR ARISTA FLEXITIP XL (MISCELLANEOUS) ×3 IMPLANT
BAG URO DRAIN 2000ML W/SPOUT (MISCELLANEOUS) ×3 IMPLANT
BLADE SURG SZ11 CARB STEEL (BLADE) ×3 IMPLANT
CANISTER SUCT 1200ML W/VALVE (MISCELLANEOUS) ×3 IMPLANT
CATH FOLEY 2WAY  5CC 16FR (CATHETERS) ×2
CATH URTH 16FR FL 2W BLN LF (CATHETERS) ×1 IMPLANT
CHLORAPREP W/TINT 26ML (MISCELLANEOUS) ×3 IMPLANT
CLOSURE WOUND 1/2 X4 (GAUZE/BANDAGES/DRESSINGS) ×1
DEFOGGER SCOPE WARMER CLEARIFY (MISCELLANEOUS) ×3 IMPLANT
DEVICE SUTURE ENDOST 10MM (ENDOMECHANICALS) IMPLANT
DRSG TEGADERM 2-3/8X2-3/4 SM (GAUZE/BANDAGES/DRESSINGS) IMPLANT
DRSG TEGADERM 4X4.75 (GAUZE/BANDAGES/DRESSINGS) ×3 IMPLANT
GAUZE SPONGE NON-WVN 2X2 STRL (MISCELLANEOUS) IMPLANT
GLOVE BIO SURGEON STRL SZ 6 (GLOVE) ×12 IMPLANT
GLOVE INDICATOR 6.5 STRL GRN (GLOVE) ×3 IMPLANT
GOWN STRL REUS W/ TWL LRG LVL3 (GOWN DISPOSABLE) ×2 IMPLANT
GOWN STRL REUS W/ TWL XL LVL3 (GOWN DISPOSABLE) ×1 IMPLANT
GOWN STRL REUS W/TWL LRG LVL3 (GOWN DISPOSABLE) ×4
GOWN STRL REUS W/TWL MED LVL3 (GOWN DISPOSABLE) ×3 IMPLANT
GOWN STRL REUS W/TWL XL LVL3 (GOWN DISPOSABLE) ×2
HEMOSTAT ARISTA ABSORB 3G PWDR (MISCELLANEOUS) ×3 IMPLANT
IRRIGATION STRYKERFLOW (MISCELLANEOUS) ×1 IMPLANT
IRRIGATOR STRYKERFLOW (MISCELLANEOUS) ×3
IV LACTATED RINGERS 1000ML (IV SOLUTION) ×3 IMPLANT
KIT PINK PAD W/HEAD ARE REST (MISCELLANEOUS) ×3
KIT PINK PAD W/HEAD ARM REST (MISCELLANEOUS) ×1 IMPLANT
KIT RM TURNOVER CYSTO AR (KITS) ×3 IMPLANT
LABEL OR SOLS (LABEL) ×3 IMPLANT
LIGASURE BLUNT 5MM 37CM (INSTRUMENTS) ×3 IMPLANT
LIQUID BAND (GAUZE/BANDAGES/DRESSINGS) ×3 IMPLANT
MANIPULATOR VCARE LG CRV RETR (MISCELLANEOUS) ×3 IMPLANT
MANIPULATOR VCARE STD CRV RETR (MISCELLANEOUS) IMPLANT
NEEDLE VERESS 14GA 120MM (NEEDLE) ×3 IMPLANT
NS IRRIG 500ML POUR BTL (IV SOLUTION) ×3 IMPLANT
OCCLUDER COLPOPNEUMO (BALLOONS) ×3 IMPLANT
PACK GYN LAPAROSCOPIC (MISCELLANEOUS) ×3 IMPLANT
PAD OB MATERNITY 4.3X12.25 (PERSONAL CARE ITEMS) ×3 IMPLANT
PAD PREP 24X41 OB/GYN DISP (PERSONAL CARE ITEMS) ×3 IMPLANT
SCISSORS METZENBAUM CVD 33 (INSTRUMENTS) ×3 IMPLANT
SET CYSTO W/LG BORE CLAMP LF (SET/KITS/TRAYS/PACK) ×6 IMPLANT
SLEEVE ENDOPATH XCEL 5M (ENDOMECHANICALS) ×6 IMPLANT
SPONGE LAP 18X18 5 PK (GAUZE/BANDAGES/DRESSINGS) IMPLANT
SPONGE VERSALON 2X2 STRL (MISCELLANEOUS)
SPONGE XRAY 4X4 16PLY STRL (MISCELLANEOUS) ×3 IMPLANT
STRIP CLOSURE SKIN 1/2X4 (GAUZE/BANDAGES/DRESSINGS) ×2 IMPLANT
SUT MON AB 4-0 RB1 27 (SUTURE) ×3 IMPLANT
SUT VIC AB 0 CT1 36 (SUTURE) ×6 IMPLANT
SUT VIC AB 2-0 UR6 27 (SUTURE) ×3 IMPLANT
SYR 50ML LL SCALE MARK (SYRINGE) ×3 IMPLANT
SYRINGE 10CC LL (SYRINGE) ×3 IMPLANT
TROCAR ENDO BLADELESS 11MM (ENDOMECHANICALS) ×3 IMPLANT
TROCAR XCEL NON-BLD 5MMX100MML (ENDOMECHANICALS) ×3 IMPLANT
TUBING INSUFFLATOR HEATED (MISCELLANEOUS) ×3 IMPLANT

## 2015-05-30 NOTE — Progress Notes (Signed)
Patient declined interpreter.

## 2015-05-30 NOTE — Anesthesia Procedure Notes (Signed)
Procedure Name: Intubation Date/Time: 05/30/2015 7:47 AM Performed by: Jonna Clark Pre-anesthesia Checklist: Patient identified, Patient being monitored, Timeout performed, Emergency Drugs available and Suction available Patient Re-evaluated:Patient Re-evaluated prior to inductionOxygen Delivery Method: Circle system utilized Preoxygenation: Pre-oxygenation with 100% oxygen Intubation Type: IV induction Ventilation: Mask ventilation without difficulty Laryngoscope Size: Mac and 3 Grade View: Grade I Tube type: Oral Tube size: 7.0 mm Number of attempts: 1 Placement Confirmation: ETT inserted through vocal cords under direct vision,  positive ETCO2 and breath sounds checked- equal and bilateral Secured at: 22 cm Tube secured with: Tape Dental Injury: Teeth and Oropharynx as per pre-operative assessment

## 2015-05-30 NOTE — Op Note (Signed)
Kerry Paul PROCEDURE DATE: 05/30/2015  PREOPERATIVE DIAGNOSIS: AUB, fibroid uterus POSTOPERATIVE DIAGNOSIS: The same PROCEDURE: Total laparoscopic hysterectomy with vaginal closure, bilateral salpingectomy, cystoscopy SURGEON:  Dr. Joylene Igo ASSISTANT: Dr. Benjaman Kindler Anesthesiologist: No responsible provider has been recorded for the case. Anesthesiologist: Alvin Critchley, MD CRNA: Demetrius Charity, CRNA; Jonna Clark, CRNA  INDICATIONS: 45 y.o. G2P2 here for definitive surgical management secondary to the indications listed under preoperative diagnoses; please see preoperative note for further details.  Risks of surgery were discussed with the patient including but not limited to: bleeding which may require transfusion or reoperation; infection which may require antibiotics; injury to bowel, bladder, ureters or other surrounding organs; need for additional procedures; thromboembolic phenomenon, incisional problems and other postoperative/anesthesia complications. Written informed consent was obtained.    FINDINGS:  12 week globular uterus, normal tubes and ovaries  ANESTHESIA:    General INTRAVENOUS FLUIDS:1300 ml ESTIMATED BLOOD LOSS:300 ml URINE OUTPUT: 2000 ml   SPECIMENS: Uterus, cervix, bilateral fallopian tubes COMPLICATIONS: None immediate  PROCEDURE IN DETAIL:  The patient received prophylactic ancef 2g intravenous antibiotics, pyridium 200mg  PO, and had sequential compression devices applied to her lower extremities while in the preoperative area.  She was then taken to the operating room where general anesthesia was administered and was found to be adequate.  She was placed in the dorsal lithotomy position, and was prepped and draped in a sterile manner.  A formal time out was performed with all team members present and in agreement.  A V-care uterine manipulator was placed at this time.  A Foley catheter was inserted into her bladder and attached to constant  drainage. Attention was turned to the abdomen where a 39mm incision was made with an 11# blade at through the umbilical plate. The verass needle was introduced into the abdomen with opening pressure <5. The Optiview 5-mm trocar and sleeve were then advanced without difficulty with the laparoscope under direct visualization into the abdomen.  The abdomen was then insufflated with carbon dioxide gas and adequate pneumoperitoneum was obtained.  A survey of the patient's pelvis and abdomen revealed the findings above.  Bilateral lower quadrant ports (5 mm on the right and 5 mm on the left) were then placed under direct visualization.   The bilateral round and broad ligaments were then clamped and transected with the Ligasure device.  The uterine artery was then skeletonized and a bladder flap was created.  The ureters were noted to be safely away from the area of dissection.  The bladder was then dissected off the lower uterine segment.    At this point, attention was turned to the uterine vessels, which were clamped on the left using the Ligasure.  Good hemostasis was noted overall.  The uterine vessels were clamped and cut on the right using the Ligasure.The uterosacral and cardinal ligaments were clamped, cut and ligated bilaterally .  Attention was then turned to the cervicovaginal junction, and the monopolar scissors were used on both cut and coag to transect the cervix from the surrounding vagina using the ring of the V-care as a guide. This was done circumferentially allowing total hysterectomy.  The uterus was removed through the vagina.   The vaginal cuff was then closed with a running-locked 0-vicryl taking care to incorporate the uterosacral ligaments.   The abdomen was then re-insufflated. There was a slow ooze along the vaginal cuff. 3g Ron Agee was placed on the cuff with excellent hemostasis when pressure was released from the abdomen. The bilateral fallopian  tubes were dissected off of the ovary  bilaterally and removed through the port. The ureters were reexamined bilaterally and were peristalsing normally.   Overall excellent hemostasis was noted.    Cystoscopy showed bilateral ureteral jets.  No stitches were visualized in the bladder during cystoscopy. The insufflation was stopped and 3 breaths were given from anesthesia. All trocars were then removed after the abdomen was desufflated.  All skin incisions were closed with 4-0 Vicryl subcuticular stitches. The patient tolerated the procedures well.  All instruments, needles, and sponge counts were correct x 2. The patient was taken to the recovery room awake, extubated and in stable condition.   Kerry Ang, MD

## 2015-05-30 NOTE — Interval H&P Note (Signed)
History and Physical Interval Note:  05/30/2015 7:17 AM  Kerry Paul  has presented today for surgery, with the diagnosis of Uterine fibroid  The various methods of treatment have been discussed with the patient and family. After consideration of risks, benefits and other options for treatment, the patient has consented to  Procedure(s): HYSTERECTOMY TOTAL LAPAROSCOPIC (N/A) as a surgical intervention .  The patient's history has been reviewed, patient examined, no change in status, stable for surgery.  I have reviewed the patient's chart and labs.  Questions were answered to the patient's satisfaction.    CBC Latest Ref Rng 05/28/2015 05/22/2015 04/30/2015  WBC 3.6 - 11.0 K/uL 6.6 7.2 8.2  Hemoglobin 12.0 - 16.0 g/dL 12.8 11.8(L) 8.0(L)  Hematocrit 35.0 - 47.0 % 39.3 36.9 26.6(L)  Platelets 150 - 440 K/uL 204 210 359      [place]

## 2015-05-30 NOTE — Transfer of Care (Signed)
Immediate Anesthesia Transfer of Care Note  Patient: Kerry Paul  Procedure(s) Performed: Procedure(s): HYSTERECTOMY TOTAL LAPAROSCOPIC Bilateral salpingectomy (N/A) CYSTOSCOPY (N/A)  Patient Location: PACU  Anesthesia Type:General  Level of Consciousness: sedated and responds to stimulation  Airway & Oxygen Therapy: Patient Spontanous Breathing and Patient connected to face mask oxygen  Post-op Assessment: Report given to RN and Post -op Vital signs reviewed and stable  Post vital signs: Reviewed and stable  Last Vitals:  Filed Vitals:   05/30/15 0615 05/30/15 1113  BP: 115/68 98/64  Pulse: 70 72  Temp: 36.4 C 36.6 C  Resp: 16 13    Complications: No apparent anesthesia complications

## 2015-05-30 NOTE — Anesthesia Preprocedure Evaluation (Addendum)
Anesthesia Evaluation  Patient identified by MRN, date of birth, ID band Patient awake    Reviewed: Allergy & Precautions, NPO status , Patient's Chart, lab work & pertinent test results  Airway Mallampati: III  TM Distance: >3 FB Neck ROM: Full    Dental  (+) Caps   Pulmonary neg pulmonary ROS,    Pulmonary exam normal breath sounds clear to auscultation       Cardiovascular negative cardio ROS Normal cardiovascular exam  PACs after Fe   Neuro/Psych negative neurological ROS  negative psych ROS   GI/Hepatic negative GI ROS, Neg liver ROS,   Endo/Other  Hypothyroidism   Renal/GU negative Renal ROS  negative genitourinary   Musculoskeletal negative musculoskeletal ROS (+)   Abdominal Normal abdominal exam  (+)   Peds negative pediatric ROS (+)  Hematology  (+) anemia ,   Anesthesia Other Findings   Reproductive/Obstetrics negative OB ROS                            Anesthesia Physical Anesthesia Plan  ASA: II  Anesthesia Plan: General   Post-op Pain Management:    Induction: Intravenous  Airway Management Planned: Oral ETT  Additional Equipment:   Intra-op Plan:   Post-operative Plan: Extubation in OR  Informed Consent: I have reviewed the patients History and Physical, chart, labs and discussed the procedure including the risks, benefits and alternatives for the proposed anesthesia with the patient or authorized representative who has indicated his/her understanding and acceptance.   Dental advisory given  Plan Discussed with: CRNA and Surgeon  Anesthesia Plan Comments:         Anesthesia Quick Evaluation

## 2015-05-31 DIAGNOSIS — N711 Chronic inflammatory disease of uterus: Secondary | ICD-10-CM | POA: Diagnosis not present

## 2015-05-31 LAB — CBC
HCT: 33 % — ABNORMAL LOW (ref 35.0–47.0)
HEMOGLOBIN: 10.8 g/dL — AB (ref 12.0–16.0)
MCH: 26.8 pg (ref 26.0–34.0)
MCHC: 32.8 g/dL (ref 32.0–36.0)
MCV: 81.7 fL (ref 80.0–100.0)
PLATELETS: 186 10*3/uL (ref 150–440)
RBC: 4.04 MIL/uL (ref 3.80–5.20)
RDW: 27.1 % — ABNORMAL HIGH (ref 11.5–14.5)
WBC: 10.3 10*3/uL (ref 3.6–11.0)

## 2015-05-31 MED ORDER — IBUPROFEN 600 MG PO TABS: 600.0000 mg | ORAL_TABLET | Freq: Four times a day (QID) | ORAL | Status: DC | PRN

## 2015-05-31 MED ORDER — IBUPROFEN 600 MG PO TABS
600.0000 mg | ORAL_TABLET | Freq: Four times a day (QID) | ORAL | Status: DC | PRN
  Administered 2015-05-31: 600 mg via ORAL
  Filled 2015-05-31: qty 1

## 2015-05-31 NOTE — Discharge Instructions (Signed)
Total Laparoscopic Hysterectomy, Care After Refer to this sheet in the next few weeks. These instructions provide you with information on caring for yourself after your procedure. Your health care provider may also give you more specific instructions. Your treatment has been planned according to current medical practices, but problems sometimes occur. Call your health care provider if you have any problems or questions after your procedure. WHAT TO EXPECT AFTER THE PROCEDURE  Pain and bruising at the incision sites. You will be given pain medicine to control it.  Menopausal symptoms such as hot flashes, night sweats, and insomnia if your ovaries were removed.  Sore throat from the breathing tube that was inserted during surgery. HOME CARE INSTRUCTIONS  Only take over-the-counter or prescription medicines for pain, discomfort, or fever as directed by your health care provider.   Do not take aspirin. It can cause bleeding.   Do not drive when taking pain medicine.   Follow your health care provider's advice regarding diet, exercise, lifting, driving, and general activities. - NO HEAVY LIFTING FOR 6 WEEKS  Resume your usual diet as directed and allowed.   Get plenty of rest and sleep.   Do not douche, use tampons, or have sexual intercourse for at least 6 weeks, or until your health care provider gives you permission.   KEEP STERISTRIPS ON FOR 7 DAYS UNLESS THEY FALL OFF (IF THEY FALL OFF, LEAVE THEM OFF)  Monitor your temperature and notify your health care provider of a fever.   Take showers instead of baths for 6 WEEKS   Do not drink alcohol until your health care provider gives you permission.   PLEASE TAKE COLACE TWICE DAILY FOR 4 WEEKS. Bran foods may help with constipation problems. Drinking enough fluids to keep your urine clear or pale yellow may help as well.   Try to have someone home with you for 1-2 weeks to help around the house.   Keep all of your  follow-up appointments as directed by your health care provider.  SEEK MEDICAL CARE IF:  You have swelling, redness, or increasing pain around your incision sites.   You have pus coming from your incision.   You notice a bad smell coming from your incision.   Your incision breaks open.   You feel dizzy or lightheaded.   You have pain or bleeding when you urinate.   You have persistent diarrhea.   You have persistent nausea and vomiting.   You have abnormal vaginal discharge.   You have a rash.   You have any type of abnormal reaction or develop an allergy to your medicine.   You have poor pain control with your prescribed medicine.  SEEK IMMEDIATE MEDICAL CARE IF:  You have chest pain or shortness of breath.  You have severe abdominal pain that is not relieved with pain medicine.  You have pain or swelling in your legs. MAKE SURE YOU:  Understand these instructions.  Will watch your condition.  Will get help right away if you are not doing well or get worse.   This information is not intended to replace advice given to you by your health care provider. Make sure you discuss any questions you have with your health care provider.   Document Released: 12/27/2012 Document Revised: 03/13/2013 Document Reviewed: 12/27/2012 Elsevier Interactive Patient Education 2016 [employer].  Call your doctor for increased pain or vaginal bleeding, temperature above 100.4, depression, or concerns.  No strenuous activity or heavy lifting for 6 weeks.  No  intercourse, tampons, douching, or enemas for 6 weeks.  No tub baths-showers only.  No driving for 2 weeks or while taking pain medications.  Call your doctor for incision concerns including redness, swelling, bleeding or drainage, or if begins to come apart.

## 2015-05-31 NOTE — Discharge Summary (Signed)
Physician Discharge Summary  Patient ID: Kerry Paul MRN: [id number] DOB/AGE: 1970/07/21 45 y.o.  Admit date: 05/30/2015 Discharge date: 05/31/2015  Admission Diagnoses: TLH/BS/cystoscopy  Discharge Diagnoses:  Active Problems:   S/P hysterectomy   Discharged Condition: good  Hospital Course: 45yo G2P2 who presented for TLH/BS/cystoscopy for AUB. Uncomplicated surgery. Uncomplicated post op course. D/C home on POD#1.   Consults: None  Significant Diagnostic Studies: labs: cbc  Treatments: IV hydration and analgesia: percocet  Discharge Exam: Blood pressure 91/52, pulse 64, temperature 98.2 F (36.8 C), temperature source Oral, resp. rate 18, last menstrual period 04/23/2015, SpO2 98 %. General appearance: alert, cooperative and no distress GI: soft, non-tender; bowel sounds normal; no masses,  no organomegaly Pelvic: vagina normal without discharge Extremities: extremities normal, atraumatic, no cyanosis or edema  Disposition: 01-Home or Self Care  Discharge Instructions    Call MD for:  difficulty breathing, headache or visual disturbances    Complete by:  As directed      Call MD for:  extreme fatigue    Complete by:  As directed      Call MD for:  hives    Complete by:  As directed      Call MD for:  persistant dizziness or light-headedness    Complete by:  As directed      Call MD for:  persistant nausea and vomiting    Complete by:  As directed      Call MD for:  redness, tenderness, or signs of infection (pain, swelling, redness, odor or green/yellow discharge around incision site)    Complete by:  As directed      Call MD for:  severe uncontrolled pain    Complete by:  As directed      Call MD for:  temperature >100.4    Complete by:  As directed      Diet - low sodium heart healthy    Complete by:  As directed      Increase activity slowly    Complete by:  As directed             Medication List    STOP taking these medications        ferrous  sulfate 325 (65 FE) MG tablet     medroxyPROGESTERone 10 MG tablet  Commonly known as:  PROVERA      TAKE these medications        docusate sodium 100 MG capsule  Commonly known as:  COLACE  Take 1 capsule (100 mg total) by mouth 2 (two) times daily.     ibuprofen 600 MG tablet  Commonly known as:  ADVIL,MOTRIN  Take 1 tablet (600 mg total) by mouth every 6 (six) hours as needed for moderate pain.     levothyroxine 50 MCG tablet  Commonly known as:  SYNTHROID, LEVOTHROID  Take 50 mcg by mouth daily before breakfast.     multivitamin tablet  Take 1 tablet by mouth daily.     OVER THE COUNTER MEDICATION  Take 4 tablets by mouth daily. BEEF LIVER     oxyCODONE 5 MG immediate release tablet  Commonly known as:  Oxy IR/ROXICODONE  Take 1 tablet (5 mg total) by mouth every 4 (four) hours as needed for moderate pain.           Follow-up Information    Follow up with Lorette Ang, MD In 2 weeks.   Specialty:  Obstetrics and Gynecology   Contact information:   [phone]  [place] [address] [phone]       Follow up with Lorette Ang, MD In 6 weeks.   Specialty:  Obstetrics and Gynecology   Contact information:   [place] [address] [phone]       Follow up with Lorette Ang, MD In 2 weeks.   Specialty:  Obstetrics and Gynecology   Why:  For wound re-check   Contact information:   [place] [address] [phone]       Signed: Lorette Ang 05/31/2015, 3:45 PM

## 2015-05-31 NOTE — Progress Notes (Signed)
Obstetric and Gynecology  Subjective  Kerry Paul is a 45 y.o. female who presented on 05/30/2015 for Total laparoscopic hysterectomy with vaginal closure, bilateral salpingectomy, cystoscopy d/t AUB and fibroid uterus.  Foley came out around 6am and patient voided at 11am without difficulty.  She reports good pain control with Ibuprofen and Roxicodone.  She is tolerating POs and denies n/v/c/d, dizziness.       Objective   Filed Vitals:   05/31/15 0346 05/31/15 0931  BP: 99/58 93/50  Pulse: 57 70  Temp: 98.7 F (37.1 C) 98.1 F (36.7 C)  Resp: 18 20     Intake/Output Summary (Last 24 hours) at 05/31/15 1107 Last data filed at 05/31/15 1025  Gross per 24 hour  Intake    740 ml  Output   4000 ml  Net  -3260 ml    General: NAD Cardiovascular: RRR, no murmurs Pulmonary: CTAB Abdomen: Benign. Non-tender, +BS, no guarding. Gauze bandages removed from 3 laparoscopic sites - steri strips intact, no significant drainage, no erythema Extremities: No erythema or cords, no calf tenderness, +warmth with normal peripheral pulses.  Labs: Results for orders placed or performed during the hospital encounter of 05/30/15 (from the past 24 hour(s))  CBC     Status: Abnormal   Collection Time: 05/31/15  5:58 AM  Result Value Ref Range   WBC 10.3 3.6 - 11.0 K/uL   RBC 4.04 3.80 - 5.20 MIL/uL   Hemoglobin 10.8 (L) 12.0 - 16.0 g/dL   HCT 33.0 (L) 35.0 - 47.0 %   MCV 81.7 80.0 - 100.0 fL   MCH 26.8 26.0 - 34.0 pg   MCHC 32.8 32.0 - 36.0 g/dL   RDW 27.1 (H) 11.5 - 14.5 %   Platelets 186 150 - 440 K/uL     Assessment   45 y.o. S/P TLH and bilateral syalpingectomy Hospital Day: 2 - doing well, stable  ABL Anemia   Plan   1. D/C home today after she voids and ambulates - pt. May shower 2. D/C IV 3. F/U in 2 weeks with Dr. Newman Nip for post-op  Lars Pinks, CNM

## 2015-05-31 NOTE — Progress Notes (Signed)
Discharge instructions provided.  Pt and sig other verbalize understanding of all instructions and follow-up care via interpreter.  Pt discharged to home at 1330 on 05/31/15 via wheelchair by CNA. Reed Breech, RN 05/31/2015 8:12 PM

## 2015-06-02 LAB — SURGICAL PATHOLOGY

## 2015-06-02 NOTE — Anesthesia Postprocedure Evaluation (Signed)
Anesthesia Post Note  Patient: Kerry Paul  Procedure(s) Performed: Procedure(s) (LRB): HYSTERECTOMY TOTAL LAPAROSCOPIC Bilateral salpingectomy (N/A) CYSTOSCOPY (N/A)  Patient location during evaluation: PACU Anesthesia Type: General Level of consciousness: awake and alert and oriented Pain management: pain level controlled Vital Signs Assessment: post-procedure vital signs reviewed and stable Respiratory status: spontaneous breathing Cardiovascular status: blood pressure returned to baseline Anesthetic complications: no    Last Vitals:  Filed Vitals:   05/31/15 0931 05/31/15 1212  BP: 93/50 91/52  Pulse: 70 64  Temp: 36.7 C 36.8 C  Resp: 20 18    Last Pain:  Filed Vitals:   05/31/15 1837  PainSc: 3                  Brindle Leyba

## 2015-06-06 NOTE — Progress Notes (Signed)
Kerry Paul  [phone] [phone]  ID: Phineas Inches OB: Jun 26, 1970  MR#: [id number]  [id number]  Patient Care Team: Lorette Ang, MD as PCP - General (Obstetrics and Gynecology)  CHIEF COMPLAINT:  Chief Complaint  Patient presents with  . Anemia    INTERVAL HISTORY: Patient Returns to clinic today for repeat laboratory work and further evaluation. She continues to feel well and is asymptomatic. She has no neurologic complaints. She denies any recent fevers. She does not complain of weakness or fatigue today. She has no chest pain or shortness of breath. She denies any nausea, vomiting, constipation, or diarrhea. She has no urinary complaints. Patient offers no specific complaints today.  REVIEW OF SYSTEMS:   Review of Systems  Constitutional: Negative for fever, weight loss and malaise/fatigue.  Respiratory: Negative.  Negative for cough.   Cardiovascular: Negative.  Negative for chest pain.  Gastrointestinal: Negative.  Negative for blood in stool and melena.  Genitourinary: Negative.   Musculoskeletal: Negative.   Neurological: Negative.  Negative for weakness.    As per HPI. Otherwise, a complete review of systems is negatve.  PAST MEDICAL HISTORY: Past Medical History  Diagnosis Date  . Thyroid disease   . Hypothyroidism   . 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 Anemia     receiving IV Iron Infusions at [facility]    PAST SURGICAL HISTORY: Past Surgical History  Procedure Laterality Date  . No past surgeries    . Laparoscopic hysterectomy N/A 05/30/2015    Procedure: HYSTERECTOMY TOTAL LAPAROSCOPIC Bilateral salpingectomy;  Surgeon: Lorette Ang, MD;  Location: [facility] ORS;  Service: Gynecology;  Laterality: N/A;  . Cystoscopy N/A 05/30/2015    Procedure: CYSTOSCOPY;  Surgeon: Lorette Ang, MD;  Location: [facility] ORS;  Service: Gynecology;  Laterality: N/A;    FAMILY HISTORY: No  reported history of malignancy or chronic disease.     ADVANCED DIRECTIVES:    HEALTH MAINTENANCE: Social History  Substance Use Topics  . Smoking status: Never Smoker   . Smokeless tobacco: Not on file  . Alcohol Use: No     Colonoscopy:  PAP:  Bone density:  Lipid panel:  No Known Allergies  Current Outpatient Prescriptions  Medication Sig Dispense Refill  . levothyroxine (SYNTHROID, LEVOTHROID) 50 MCG tablet Take 50 mcg by mouth daily before breakfast.    . Multiple Vitamin (MULTIVITAMIN) tablet Take 1 tablet by mouth daily.    Marland Kitchen OVER THE COUNTER MEDICATION Take 4 tablets by mouth daily. BEEF LIVER    . docusate sodium (COLACE) 100 MG capsule Take 1 capsule (100 mg total) by mouth 2 (two) times daily. 60 capsule 0  . ibuprofen (ADVIL,MOTRIN) 600 MG tablet Take 1 tablet (600 mg total) by mouth every 6 (six) hours as needed for moderate pain. 60 tablet 0  . oxyCODONE (OXY IR/ROXICODONE) 5 MG immediate release tablet Take 1 tablet (5 mg total) by mouth every 4 (four) hours as needed for moderate pain. 20 tablet 0   No current facility-administered medications for this visit.    OBJECTIVE: Filed Vitals:   05/28/15 0907  BP: 103/69  Pulse: 65  Temp: 98.2 F (36.8 C)  Resp: 16     Body mass index is 29.34 kg/(m^2).    ECOG FS:0 - Asymptomatic  General: Well-developed, well-nourished, no acute distress. Eyes: Pink conjunctiva, anicteric sclera. Lungs: Clear to auscultation bilaterally. Heart: Regular rate and rhythm. No rubs, murmurs, or gallops. Abdomen: Soft, nontender,  nondistended. No organomegaly noted, normoactive bowel sounds. Musculoskeletal: No edema, cyanosis, or clubbing. Neuro: Alert, answering all questions appropriately. Cranial nerves grossly intact. Skin: No rashes or petechiae noted. Psych: Normal affect.   LAB RESULTS:  No results found for: NA, K, CL, CO2, GLUCOSE, BUN, CREATININE, CALCIUM, PROT, ALBUMIN, AST, ALT, ALKPHOS, BILITOT, GFRNONAA,  GFRAA  Lab Results  Component Value Date   WBC 10.3 05/31/2015   NEUTROABS 3.6 05/28/2015   HGB 10.8* 05/31/2015   HCT 33.0* 05/31/2015   MCV 81.7 05/31/2015   PLT 186 05/31/2015   Lab Results  Component Value Date   IRON 82 05/28/2015   TIBC 355 05/28/2015   IRONPCTSAT 23 05/28/2015    Lab Results  Component Value Date   FERRITIN 169 05/28/2015     STUDIES: No results found.  ASSESSMENT: Iron deficiency anemia secondary to heavy menstrual bleeding.  PLAN:    1. Iron deficiency anemia: Patient hemoglobin is now within normal limits at 12.8. Her iron stores are also now within normal limits. She does not require any additional iron or a blood transfusion today. Patient is having a hysterectomy later this week to help resolve her heavy menstrual bleeding. No further follow-up is necessary. If patient's hemoglobin or iron stores trend down, please refer her back for additional evaluation and treatment.  Patient expressed understanding and was in agreement with this plan. She also understands that She can call clinic at any time with any questions, concerns, or complaints.    Lloyd Huger, MD   06/06/2015 2:41 PM

## 2017-01-20 ENCOUNTER — Other Ambulatory Visit: Payer: Self-pay | Admitting: Family Medicine

## 2017-01-20 DIAGNOSIS — Z1239 Encounter for other screening for malignant neoplasm of breast: Secondary | ICD-10-CM

## 2017-02-02 ENCOUNTER — Other Ambulatory Visit: Payer: Self-pay | Admitting: Family Medicine

## 2017-02-02 DIAGNOSIS — R945 Abnormal results of liver function studies: Principal | ICD-10-CM

## 2017-02-02 DIAGNOSIS — R7303 Prediabetes: Secondary | ICD-10-CM

## 2017-02-02 DIAGNOSIS — R7989 Other specified abnormal findings of blood chemistry: Secondary | ICD-10-CM

## 2017-02-21 ENCOUNTER — Ambulatory Visit
Admission: RE | Admit: 2017-02-21 | Discharge: 2017-02-21 | Disposition: A | Discharge status: Home / Self Care | Payer: BC Managed Care – PPO | Source: Ambulatory Visit | Attending: Family Medicine | Admitting: Family Medicine

## 2017-02-21 DIAGNOSIS — R945 Abnormal results of liver function studies: Secondary | ICD-10-CM | POA: Insufficient documentation

## 2017-02-21 DIAGNOSIS — R7989 Other specified abnormal findings of blood chemistry: Secondary | ICD-10-CM

## 2017-02-21 DIAGNOSIS — R7303 Prediabetes: Secondary | ICD-10-CM | POA: Insufficient documentation

## 2017-02-22 ENCOUNTER — Ambulatory Visit
Admission: RE | Admit: 2017-02-22 | Discharge: 2017-02-22 | Disposition: A | Discharge status: Home / Self Care | Payer: BC Managed Care – PPO | Source: Ambulatory Visit | Attending: Family Medicine | Admitting: Family Medicine

## 2017-02-22 ENCOUNTER — Encounter (INDEPENDENT_AMBULATORY_CARE_PROVIDER_SITE_OTHER): Payer: Self-pay

## 2017-02-22 DIAGNOSIS — Z1231 Encounter for screening mammogram for malignant neoplasm of breast: Secondary | ICD-10-CM | POA: Insufficient documentation

## 2017-02-22 DIAGNOSIS — Z1239 Encounter for other screening for malignant neoplasm of breast: Secondary | ICD-10-CM

## 2018-06-21 ENCOUNTER — Other Ambulatory Visit: Payer: Self-pay | Admitting: Family Medicine

## 2018-06-21 DIAGNOSIS — Z1231 Encounter for screening mammogram for malignant neoplasm of breast: Secondary | ICD-10-CM

## 2018-08-22 ENCOUNTER — Other Ambulatory Visit: Payer: Self-pay

## 2018-08-22 ENCOUNTER — Encounter (INDEPENDENT_AMBULATORY_CARE_PROVIDER_SITE_OTHER): Payer: Self-pay

## 2018-08-22 ENCOUNTER — Ambulatory Visit
Admission: RE | Admit: 2018-08-22 | Discharge: 2018-08-22 | Disposition: A | Discharge status: Home / Self Care | Payer: BC Managed Care – PPO | Source: Ambulatory Visit | Attending: Family Medicine | Admitting: Family Medicine

## 2018-08-22 DIAGNOSIS — Z1231 Encounter for screening mammogram for malignant neoplasm of breast: Secondary | ICD-10-CM

## 2020-02-18 ENCOUNTER — Other Ambulatory Visit: Payer: Self-pay | Admitting: Family Medicine

## 2020-02-18 DIAGNOSIS — Z1231 Encounter for screening mammogram for malignant neoplasm of breast: Secondary | ICD-10-CM

## 2020-03-26 ENCOUNTER — Inpatient Hospital Stay: Admission: RE | Admit: 2020-03-26 | Payer: BC Managed Care – PPO | Source: Ambulatory Visit

## 2020-10-26 IMAGING — MG DIGITAL SCREENING BILATERAL MAMMOGRAM WITH TOMO AND CAD
8 series · 8 of 24 positions shown · non-contrast
Comparison: Previous exam(s).

CLINICAL DATA: Screening.

EXAM:
DIGITAL SCREENING BILATERAL MAMMOGRAM WITH TOMO AND CAD

[R MLO synth-2D]
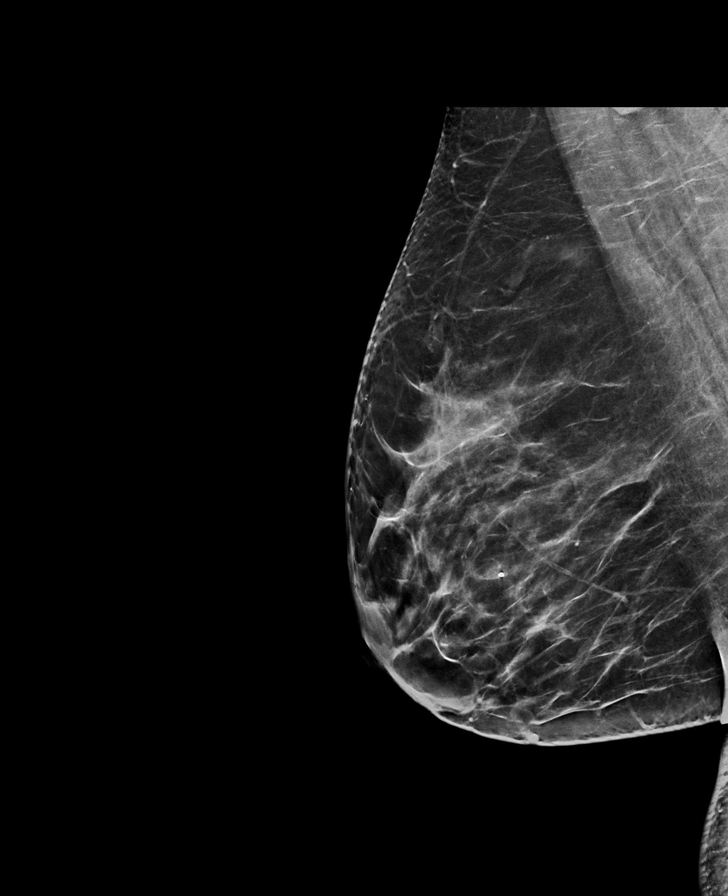

[L CC synth-2D]
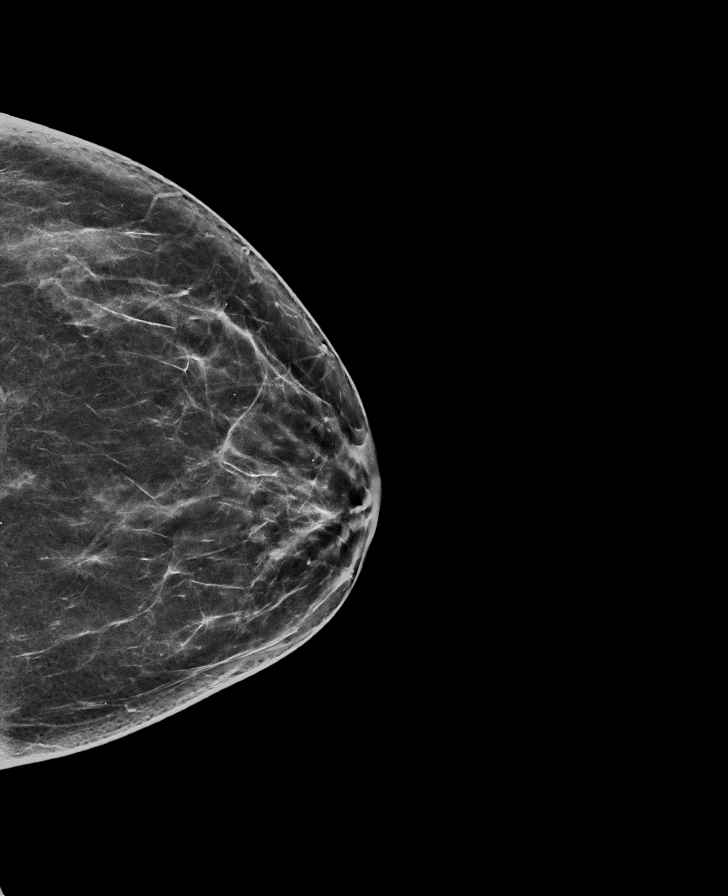

[L MLO synth-2D]
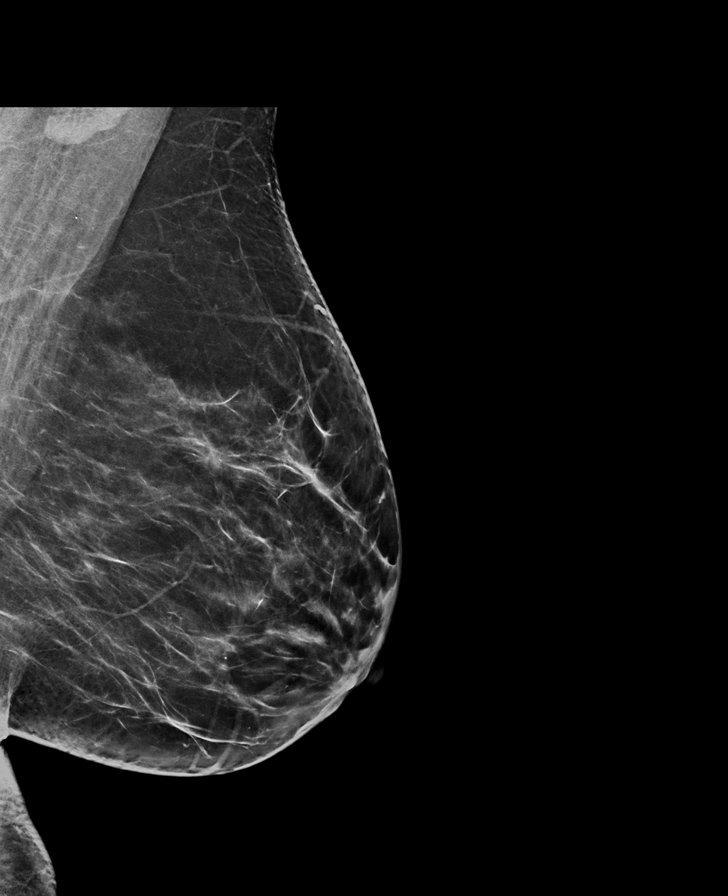

[R CC synth-2D]
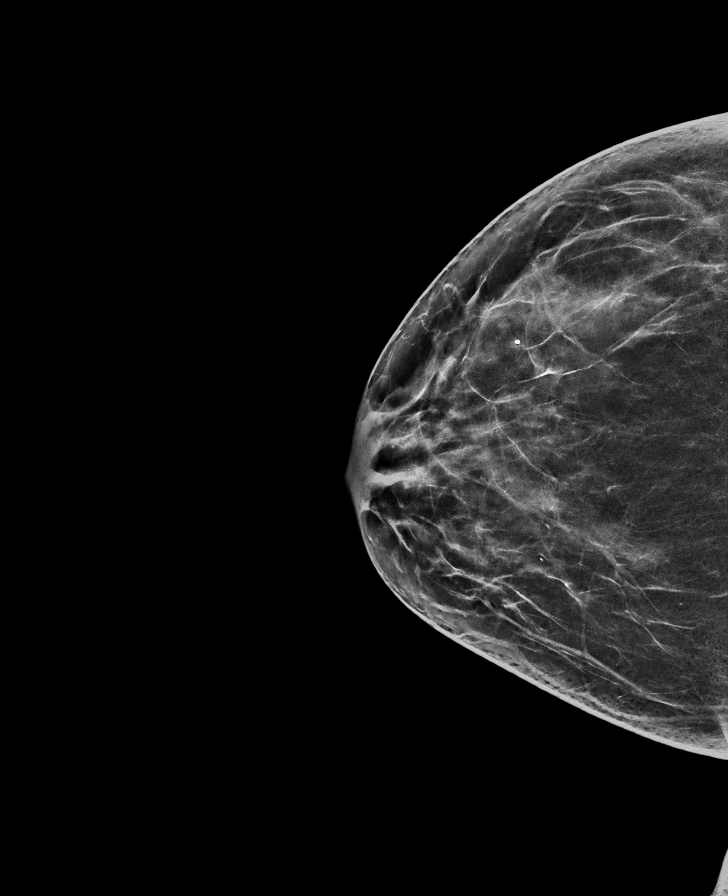

[L MLO tomo · tomo slice 39/78.0]
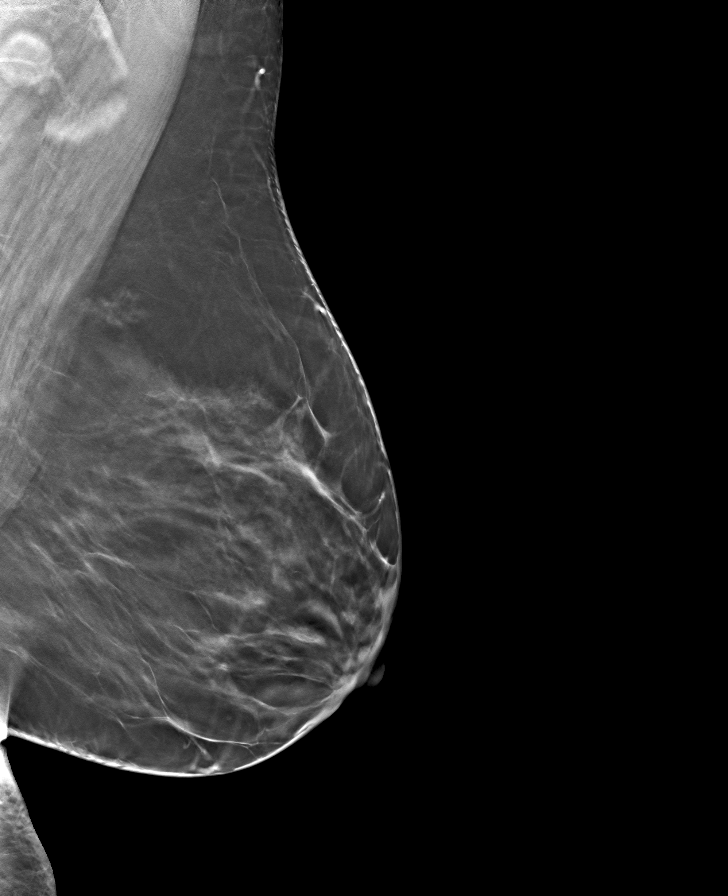

[R MLO tomo · tomo slice 41/80.0]
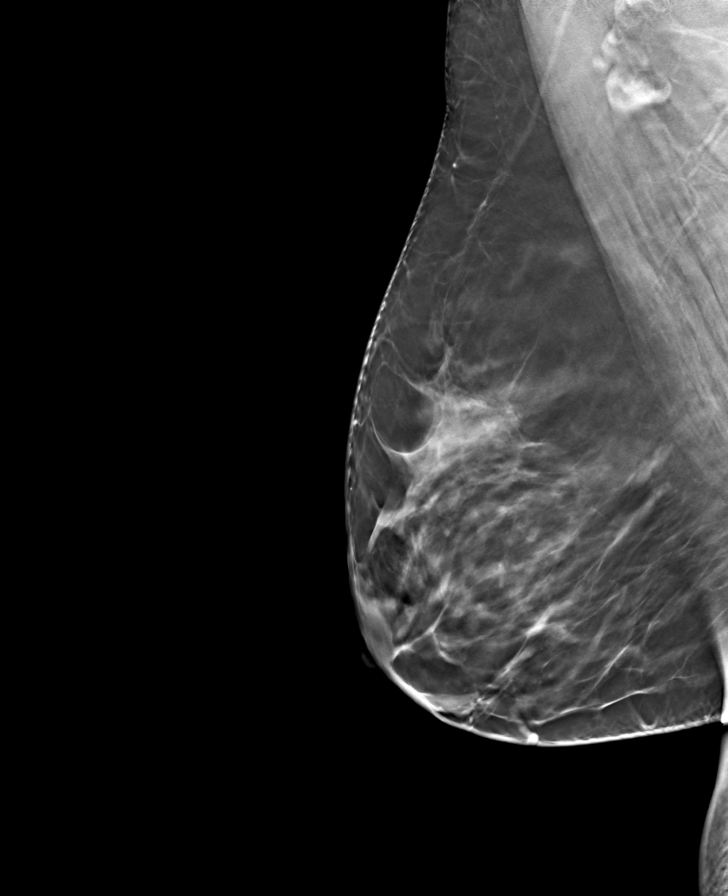

[L CC tomo · tomo slice 37/73.0]
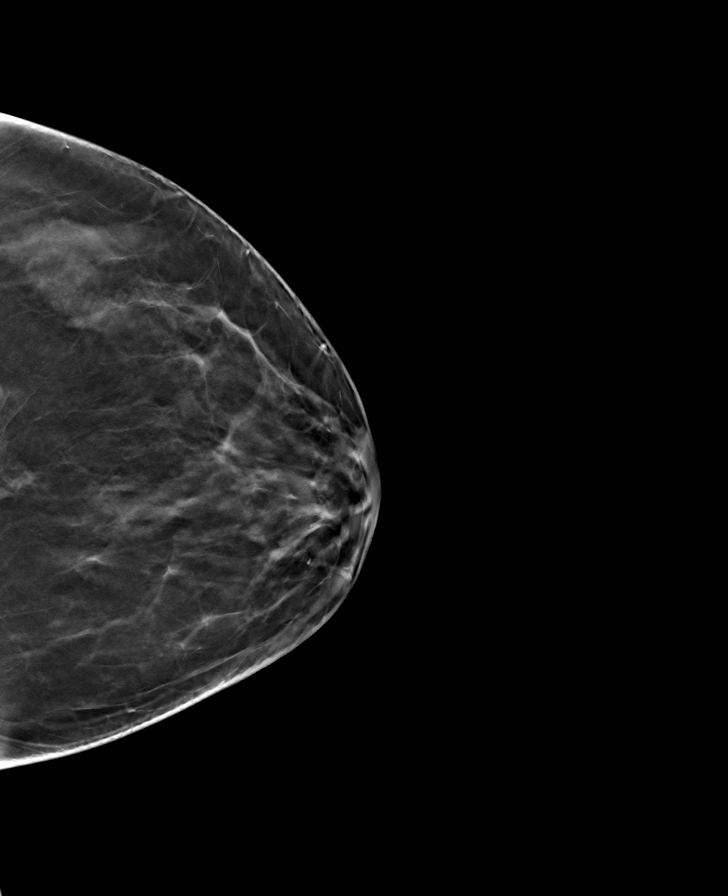

[R CC tomo · tomo slice 35/69.0]
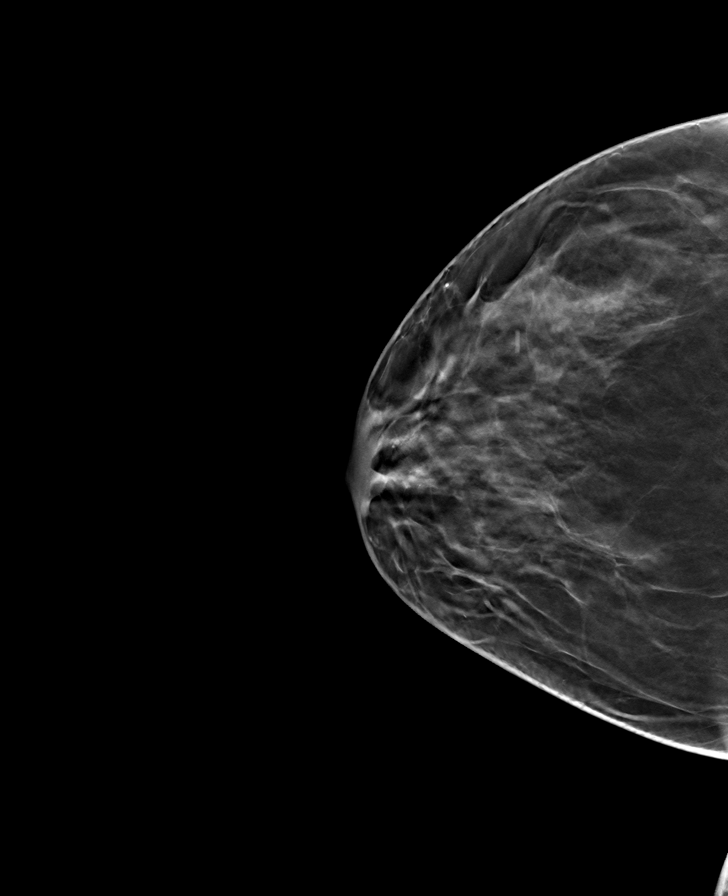

[8 of 24 positions shown; findings below may reference images not displayed]

ACR Breast Density Category c: The breast tissue is heterogeneously
dense, which may obscure small masses.
FINDINGS: There are no findings suspicious for malignancy. Images were
processed with CAD.
IMPRESSION: No mammographic evidence of malignancy. A result letter of this
screening mammogram will be mailed directly to the patient.

RECOMMENDATION:
Screening mammogram in one year. (Code:[id number]-LHB)

BI-RADS CATEGORY  1: Negative.

## 2024-04-17 ENCOUNTER — Encounter: Payer: Self-pay | Admitting: Oncology

## 2024-04-17 ENCOUNTER — Ambulatory Visit
Admission: EM | Admit: 2024-04-17 | Discharge: 2024-04-17 | Disposition: A | Discharge status: Home / Self Care | Payer: PRIVATE HEALTH INSURANCE | Source: Home / Self Care

## 2024-04-17 ENCOUNTER — Ambulatory Visit: Payer: PRIVATE HEALTH INSURANCE

## 2024-04-17 DIAGNOSIS — T148XXA Other injury of unspecified body region, initial encounter: Secondary | ICD-10-CM

## 2024-04-17 DIAGNOSIS — R0789 Other chest pain: Secondary | ICD-10-CM | POA: Diagnosis not present

## 2024-04-17 DIAGNOSIS — R10A1 Flank pain, right side: Secondary | ICD-10-CM

## 2024-04-17 LAB — POCT URINE DIPSTICK
Bilirubin, UA: NEGATIVE
Blood, UA: NEGATIVE
Glucose, UA: NEGATIVE mg/dL
Ketones, POC UA: NEGATIVE mg/dL
Nitrite, UA: NEGATIVE
Protein Ur, POC: NEGATIVE mg/dL
Spec Grav, UA: 1.01
Urobilinogen, UA: 0.2 U/dL
pH, UA: 6

## 2024-04-17 MED ORDER — NAPROXEN 500 MG PO TABS: 500.0000 mg | ORAL_TABLET | Freq: Two times a day (BID) | ORAL | 0 refills | Status: AC | PRN

## 2024-04-17 MED ORDER — CYCLOBENZAPRINE HCL 10 MG PO TABS: 10.0000 mg | ORAL_TABLET | Freq: Three times a day (TID) | ORAL | 0 refills | Status: AC | PRN

## 2024-04-17 NOTE — ED Triage Notes (Signed)
 Patient to Urgent Care with complaints of RUQ pain that radiates into her back. Reports she hit her side on the counter top one week ago. Pain worse with movement.   Using advil / ice.

## 2024-04-17 NOTE — Discharge Instructions (Signed)
-   The x-ray does not show evidence of fracture. - You likely pulled a muscle in your side with the injury. - I sent an anti-inflammatory medication muscle relaxer to pharmacy.  Continue with ice, heat, muscle rubs, stretching. - If pain acutely worsens or you begin to have fever, cough, difficulty breathing, chest pain, abdominal pain, vomiting, change in bowel habits, etc. seek immediate reevaluation.

## 2024-04-17 NOTE — ED Provider Notes (Signed)
 " [facility]    CSN: [id number] Arrival date & time: 04/17/24  1513      History   Chief Complaint Chief Complaint  Patient presents with   Flank Pain    HPI Kerry Paul is a 54 y.o. female presenting for right sided rib/flank/back pain x 1 week. Symptoms began after she injured her side by hitting it on part of a tub. No bruising or swelling. She has been having some discomfort in her right shoulder recently. 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 Reports pain is intermittent in the right arm due to overuse. Rib pain worse with movement and sometime with taking a deep breath. Has been taking motrin  and using ice.   Patient denies fever, fatigue, loss of appetite, cough, chest pain, shortness of breath, abdominal pain, nausea/vomiting, change in Bms, dysuria, hematuria.   She is going home to [place] tomorrow and wanted to be seen before leaving.   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   Hypothyroidism    Thyroid disease     Patient Active Problem List   Diagnosis Date Noted   S/P hysterectomy 05/30/2015   Iron deficiency anemia 05/05/2015    Past Surgical History:  Procedure Laterality Date   ABDOMINAL HYSTERECTOMY     CYSTOSCOPY N/A 05/30/2015   Procedure: CYSTOSCOPY;  Surgeon: Orvil MARLA Arenas, MD;  Location: [facility] OR[facility];  Service: Gynecology;  Laterality: N/A;   LAPAROSCOPIC HYSTERECTOMY N/A 05/30/2015   Procedure: HYSTERECTOMY TOTAL LAPAROSCOPIC Bilateral salpingectomy;  Surgeon: Orvil MARLA Arenas, MD;  Location: [facility] OR[facility];  Service: Gynecology;  Laterality: N/A;   NO PAST SURGERIES      OB History   No obstetric history on file.      Home Medications    Prior to Admission medications  Medication Sig Start Date End Date Taking? Authorizing Provider  cyclobenzaprine  (FLEXERIL ) 10 MG tablet Take 1 tablet (10 mg  total) by mouth 3 (three) times daily as needed for muscle spasms. 04/17/24  Yes Arvis Jolan NOVAK, PA-C  naproxen  (NAPROSYN ) 500 MG tablet Take 1 tablet (500 mg total) by mouth 2 (two) times daily as needed for moderate pain (pain score 4-6). 04/17/24  Yes Arvis Jolan NOVAK, PA-C  levothyroxine (SYNTHROID, LEVOTHROID) 50 MCG tablet Take 50 mcg by mouth daily before breakfast. Patient not taking: Reported on 04/17/2024    [provider]  Multiple Vitamin (MULTIVITAMIN) tablet Take 1 tablet by mouth daily.    [provider]  OVER THE COUNTER MEDICATION Take 4 tablets by mouth daily. BEEF LIVER    [provider]    Family History Family History  Problem Relation Age of Onset   Breast cancer Neg Hx     Social History Social History[1]   Allergies   Patient has no known allergies.   Review of Systems Review of Systems  Constitutional:  Negative for fatigue and fever.  Respiratory:  Negative for cough and shortness of breath.   Cardiovascular:  Negative for chest pain.  Gastrointestinal:  Negative for abdominal pain, nausea and vomiting.  Genitourinary:  Positive for flank pain. Negative for difficulty urinating, dysuria, frequency, hematuria and urgency.  Musculoskeletal:  Positive for arthralgias (right rib and arm pain) and back pain. Negative for gait problem and neck pain.  Skin:  Negative for color change and wound.  Neurological:  Negative for weakness and numbness.     Physical Exam Triage Vital Signs ED Triage Vitals  Encounter Vitals Group     BP      Girls Systolic BP Percentile      Girls Diastolic BP Percentile      Boys Systolic BP Percentile      Boys Diastolic BP Percentile      Pulse      Resp      Temp      Temp src      SpO2      Weight      Height      Head Circumference      Peak Flow      Pain Score      Pain Loc      Pain Education      Exclude from Growth Chart    No data found.  Updated Vital Signs BP 118/82    Pulse 61   Temp 98.1 F (36.7 C)   Resp 19   Wt 191 lb 9.6 oz (86.9 kg)   LMP 04/23/2015   SpO2 98%   BMI 29.13 kg/m     Physical Exam Vitals and nursing note reviewed.  Constitutional:      General: She is not in acute distress.    Appearance: Normal appearance. She is not ill-appearing or toxic-appearing.  HENT:     Head: Normocephalic and atraumatic.  Eyes:     General: No scleral icterus.       Right eye: No discharge.        Left eye: No discharge.     Conjunctiva/sclera: Conjunctivae normal.  Cardiovascular:     Rate and Rhythm: Normal rate and regular rhythm.     Heart sounds: Normal heart sounds.  Pulmonary:     Effort: Pulmonary effort is normal. No respiratory distress.     Breath sounds: Normal breath sounds.  Chest:     Chest wall: Tenderness (right lateral ribs) present.  Abdominal:     Palpations: Abdomen is soft.     Tenderness: There is no abdominal tenderness. There is no right CVA tenderness or left CVA tenderness.  Musculoskeletal:     Cervical back: Neck supple.  Skin:    General: Skin is dry.  Neurological:     General: No focal deficit present.     Mental Status: She is alert. Mental status is at baseline.     Motor: No weakness.     Gait: Gait normal.  Psychiatric:        Mood and Affect: Mood normal.        Behavior: Behavior normal.      UC Treatments / Results  Labs (all labs ordered are listed, but only abnormal results are displayed) Labs Reviewed  POCT URINE DIPSTICK - Abnormal; Notable for the following components:      Result Value   Leukocytes, UA Small (1+) (*)    All other components within normal limits    EKG   Radiology DG Ribs Unilateral W/Chest Right Result Date: 04/17/2024 EXAM: AP VIEW XRAY OF THE RIGHT RIBS AND CHEST 04/17/2024 04:11:49 PM COMPARISON: None available. CLINICAL HISTORY: Pain in right side after hitting on counter top one week ago. FINDINGS: BONES: No acute displaced rib fracture. LUNGS AND  PLEURA: No consolidation or pulmonary edema. No pleural effusion or pneumothorax. HEART AND MEDIASTINUM: No acute abnormality of the cardiac and mediastinal silhouettes. IMPRESSION: 1. No acute right rib fracture. Electronically signed  by: Elsie Gravely MD 04/17/2024 04:40 PM EST RP Workstation: [id number]    Procedures Procedures (including critical care time)  Medications Ordered in UC Medications - No data to display  Initial Impression / Assessment and Plan / UC Course  I have reviewed the triage vital signs and the nursing notes.  Pertinent labs & imaging results that were available during my care of the patient were reviewed by me and considered in my medical decision making (see chart for details).   54 year old female presents for right sided flank/back pain x 1 week.  Pain started after she accidentally hit her side on a tub before onset of pain. Pain is only with movement.   Vitals are all normal and stable.  She is overall well-appearing.  No acute distress.  Chest clear.  Heart regular rate and rhythm.  Chest tenderness throughout the right lateral ribs.  No overlying swelling or contusions.  No abdominal tenderness or CVA tenderness.  Urinalysis obtained shows small leukocytes. Denies urinary symptoms. Low suspicion for UTI.  X-ray of right ribs/chest obtained to assess for possible fracture given injury.  Negative imaging.  Reviewed with patient.  Advised patient she likely pulled a muscle or bruised a rib. Supportive care discussed. Sent cyclobenzaprine  and naproxen . Advised seeking re-evaluation if fever, urinary symptoms, respiratory symptoms, abdominal pain, etc.    Final Clinical Impressions(s) / UC Diagnoses   Final diagnoses:  Right flank pain  Rib pain on right side  Muscle strain     Discharge Instructions      - The x-ray does not show evidence of fracture. - You likely pulled a muscle in your side with the injury. - I sent an anti-inflammatory  medication muscle relaxer to pharmacy.  Continue with ice, heat, muscle rubs, stretching. - If pain acutely worsens or you begin to have fever, cough, difficulty breathing, chest pain, abdominal pain, vomiting, change in bowel habits, etc. seek immediate reevaluation.     ED Prescriptions     Medication Sig Dispense Auth. Provider   cyclobenzaprine  (FLEXERIL ) 10 MG tablet Take 1 tablet (10 mg total) by mouth 3 (three) times daily as needed for muscle spasms. 20 tablet Arvis Huxley B, PA-C   naproxen  (NAPROSYN ) 500 MG tablet Take 1 tablet (500 mg total) by mouth 2 (two) times daily as needed for moderate pain (pain score 4-6). 30 tablet Arvis Huxley NOVAK, PA-C      I have reviewed the PDMP during this encounter.     [1]  Social History Tobacco Use   Smoking status: Never  Substance Use Topics   Alcohol use: No   Drug use: No     Arvis Huxley NOVAK DEVONNA 04/20/24 [id number]  "
